# Patient Record
Sex: Male | Born: 1980 | Race: Asian | Hispanic: Yes | Marital: Single | Smoking: Light tobacco smoker
Health system: Southern US, Community
[De-identification: ages and names within clinical notes are randomized; demographics above are authoritative.]

## PROBLEM LIST (undated history)

## (undated) DIAGNOSIS — I10 Essential (primary) hypertension: Secondary | ICD-10-CM

---

## 2017-10-12 ENCOUNTER — Emergency Department (HOSPITAL_COMMUNITY): Payer: Worker's Compensation

## 2017-10-12 ENCOUNTER — Emergency Department (HOSPITAL_COMMUNITY)
Admission: EM | Admit: 2017-10-12 | Discharge: 2017-10-12 | Disposition: A | Discharge status: Other Acute Inpatient Hospital | Payer: Worker's Compensation | Attending: Student | Admitting: Student

## 2017-10-12 ENCOUNTER — Encounter (HOSPITAL_COMMUNITY): Payer: Self-pay | Admitting: Emergency Medicine

## 2017-10-12 DIAGNOSIS — W231XXA Caught, crushed, jammed, or pinched between stationary objects, initial encounter: Secondary | ICD-10-CM | POA: Diagnosis not present

## 2017-10-12 DIAGNOSIS — Z23 Encounter for immunization: Secondary | ICD-10-CM | POA: Insufficient documentation

## 2017-10-12 DIAGNOSIS — Y929 Unspecified place or not applicable: Secondary | ICD-10-CM | POA: Insufficient documentation

## 2017-10-12 DIAGNOSIS — Y990 Civilian activity done for income or pay: Secondary | ICD-10-CM | POA: Insufficient documentation

## 2017-10-12 DIAGNOSIS — S63295A Dislocation of distal interphalangeal joint of left ring finger, initial encounter: Secondary | ICD-10-CM | POA: Diagnosis not present

## 2017-10-12 DIAGNOSIS — S6992XA Unspecified injury of left wrist, hand and finger(s), initial encounter: Secondary | ICD-10-CM | POA: Diagnosis present

## 2017-10-12 DIAGNOSIS — I10 Essential (primary) hypertension: Secondary | ICD-10-CM | POA: Insufficient documentation

## 2017-10-12 DIAGNOSIS — Y939 Activity, unspecified: Secondary | ICD-10-CM | POA: Insufficient documentation

## 2017-10-12 HISTORY — DX: Essential (primary) hypertension: I10

## 2017-10-12 LAB — CBC WITH DIFFERENTIAL/PLATELET
BASOS PCT: 0 %
Basophils Absolute: 0 10*3/uL (ref 0.0–0.1)
Eosinophils Absolute: 0.2 10*3/uL (ref 0.0–0.7)
Eosinophils Relative: 2 %
HEMATOCRIT: 40.9 % (ref 39.0–52.0)
HEMOGLOBIN: 13.7 g/dL (ref 13.0–17.0)
LYMPHS ABS: 2.8 10*3/uL (ref 0.7–4.0)
Lymphocytes Relative: 33 %
MCH: 30.1 pg (ref 26.0–34.0)
MCHC: 33.5 g/dL (ref 30.0–36.0)
MCV: 89.9 fL (ref 78.0–100.0)
MONO ABS: 0.6 10*3/uL (ref 0.1–1.0)
MONOS PCT: 7 %
NEUTROS ABS: 5 10*3/uL (ref 1.7–7.7)
NEUTROS PCT: 58 %
Platelets: 174 10*3/uL (ref 150–400)
RBC: 4.55 MIL/uL (ref 4.22–5.81)
RDW: 12.7 % (ref 11.5–15.5)
WBC: 8.6 10*3/uL (ref 4.0–10.5)

## 2017-10-12 LAB — COMPREHENSIVE METABOLIC PANEL
ALBUMIN: 4 g/dL (ref 3.5–5.0)
ALK PHOS: 165 U/L — AB (ref 38–126)
ALT: 154 U/L — ABNORMAL HIGH (ref 17–63)
ANION GAP: 9 (ref 5–15)
AST: 79 U/L — ABNORMAL HIGH (ref 15–41)
BILIRUBIN TOTAL: 0.7 mg/dL (ref 0.3–1.2)
BUN: 9 mg/dL (ref 6–20)
CALCIUM: 9 mg/dL (ref 8.9–10.3)
CO2: 22 mmol/L (ref 22–32)
Chloride: 108 mmol/L (ref 101–111)
Creatinine, Ser: 0.8 mg/dL (ref 0.61–1.24)
GLUCOSE: 212 mg/dL — AB (ref 65–99)
POTASSIUM: 4.1 mmol/L (ref 3.5–5.1)
Sodium: 139 mmol/L (ref 135–145)
TOTAL PROTEIN: 7.5 g/dL (ref 6.5–8.1)

## 2017-10-12 LAB — TYPE AND SCREEN
ABO/RH(D): O POS
Antibody Screen: NEGATIVE

## 2017-10-12 MED ORDER — HYDROMORPHONE HCL PF 1 MG/ML IJ SOLN
.50 | INTRAMUSCULAR | Status: DC
Start: ? — End: 2017-10-12

## 2017-10-12 MED ORDER — PIPERACILLIN-TAZOBACTAM 3.375 G IVPB 30 MIN
3.3750 g | INTRAVENOUS | Status: DC
  Filled 2017-10-12: qty 50

## 2017-10-12 MED ORDER — MORPHINE SULFATE (PF) 4 MG/ML IV SOLN
4.0000 mg | Freq: Once | INTRAVENOUS | Status: AC
  Administered 2017-10-12: 4 mg via INTRAVENOUS

## 2017-10-12 MED ORDER — ONDANSETRON HCL 4 MG/2ML IJ SOLN
4.0000 mg | Freq: Once | INTRAMUSCULAR | Status: AC
  Administered 2017-10-12: 4 mg via INTRAVENOUS
  Filled 2017-10-12: qty 2

## 2017-10-12 MED ORDER — MORPHINE SULFATE (PF) 4 MG/ML IV SOLN
4.0000 mg | Freq: Once | INTRAVENOUS | Status: AC
  Administered 2017-10-12: 4 mg via INTRAVENOUS
  Filled 2017-10-12: qty 1

## 2017-10-12 MED ORDER — TETANUS-DIPHTH-ACELL PERTUSSIS 5-2.5-18.5 LF-MCG/0.5 IM SUSP
0.5000 mL | Freq: Once | INTRAMUSCULAR | Status: AC
  Administered 2017-10-12: 0.5 mL via INTRAMUSCULAR
  Filled 2017-10-12: qty 0.5

## 2017-10-12 MED ORDER — SODIUM CHLORIDE 0.9 % IV BOLUS (SEPSIS)
1000.0000 mL | Freq: Once | INTRAVENOUS | Status: AC
  Administered 2017-10-12: 1000 mL via INTRAVENOUS

## 2017-10-12 MED ORDER — MORPHINE SULFATE (PF) 4 MG/ML IV SOLN
INTRAVENOUS | Status: AC
  Administered 2017-10-12: 4 mg via INTRAVENOUS
  Filled 2017-10-12: qty 1

## 2017-10-12 MED ORDER — CEFAZOLIN SODIUM-DEXTROSE 2-4 GM/100ML-% IV SOLN
2.0000 g | Freq: Once | INTRAVENOUS | Status: AC
  Administered 2017-10-12: 2 g via INTRAVENOUS
  Filled 2017-10-12: qty 100

## 2017-10-12 MED ORDER — PIPERACILLIN-TAZOBACTAM 3.375 G IVPB: 3.3750 g | Freq: Three times a day (TID) | INTRAVENOUS | Status: DC

## 2017-10-12 MED ORDER — LACTATED RINGERS IV SOLN
INTRAVENOUS | Status: DC
Start: 2017-10-12 — End: 2017-10-12

## 2017-10-12 NOTE — ED Triage Notes (Signed)
Pt with L hand injury, degloving, pt with decreased sensation to L hand. Pt states it was a crushing injury.

## 2017-10-12 NOTE — ED Notes (Addendum)
Called radiology to have CD made to be transported to another  facility

## 2017-10-12 NOTE — ED Provider Notes (Signed)
[facility]-EMERGENCY DEPT Provider Note   CSN: [id number] Arrival date & time: 10/12/17  1444     History   Chief Complaint Chief Complaint  Patient presents with  . Hand Injury    HPI Scott Lloyd is a 37 y.o. male.  HPI 37 year old Hispanic male past medical history significant for hypertension presents to the emergency department today with complaints of hand injury.  Patient states that he was working with an Environmental education officerindustrial roller and got his hand caught between the 2 rollers and pulled his hand back.  Patient reports decreased sensation to his left pinky finger.  Patient reports significant pain.  Unsure last tetanus shot.  Reports paresthesias and weakness to the left pinky finger.  He is not take anything for the pain prior to arrival.  Range of motion and palpation makes the pain worse. Past Medical History:  Diagnosis Date  . Hypertension     There are no active problems to display for this patient.   History reviewed. No pertinent surgical history.     Home Medications    Prior to Admission medications   Not on File    Family History History reviewed. No pertinent family history.  Social History Social History   Tobacco Use  . Smoking status: Light Tobacco Smoker  . Smokeless tobacco: Former Engineer, waterUser  Substance Use Topics  . Alcohol use: Yes  . Drug use: No     Allergies   Patient has no known allergies.   Review of Systems Review of Systems  Constitutional: Negative for chills and fever.  Musculoskeletal: Positive for myalgias.  Skin: Positive for wound.  Neurological: Positive for weakness and numbness.     Physical Exam Updated Vital Signs BP (!) 147/105   Pulse 93   Temp 98.2 F (36.8 C)   Resp 20   SpO2 99%   Physical Exam  Constitutional: He appears well-developed and well-nourished. No distress.  HENT:  Head: Normocephalic and atraumatic.  Eyes: Conjunctivae are normal. Pupils are equal, round, and reactive  to light. Right eye exhibits no discharge. Left eye exhibits no discharge. No scleral icterus.  Neck: Normal range of motion.  Cardiovascular: Normal rate.  Pulmonary/Chest: Effort normal. No respiratory distress.  Abdominal: Soft.  Musculoskeletal: Normal range of motion.  Patient has partial degloving of the dorsum of the left hand.  Tendon and artery exposure.  No signs of arterial bleed.  Patient has full flexion extension of the first 4 fingers of the left hand at the [facility].  Patient has decreased sensation of the left pinky finger.  Patient has brisk cap refill of the first 4 fingers but does not have her brisk refill in the left pinky finger.  Significant amount of debris noted.    Radial pulses are 2+ bilaterally.  Sensation is intact in all phalanges except for the left pinky finger.  Significant pain noted.  Patient with full range of motion of the left wrist, left elbow, left shoulder without pain.  Neurological: He is alert.  Skin: Skin is warm and dry. Capillary refill takes less than 2 seconds. No pallor.  Psychiatric: His behavior is normal. Judgment and thought content normal.  Nursing note and vitals reviewed.              ED Treatments / Results  Labs (all labs ordered are listed, but only abnormal results are displayed) Labs Reviewed  COMPREHENSIVE METABOLIC PANEL - Abnormal; Notable for the following components:  Result Value   Glucose, Bld 212 (*)    AST 79 (*)    ALT 154 (*)    Alkaline Phosphatase 165 (*)    All other components within normal limits  CBC WITH DIFFERENTIAL/PLATELET  TYPE AND SCREEN    EKG  EKG Interpretation None       Radiology Dg Forearm Left  Result Date: 10/12/2017 CLINICAL DATA:  Crush injury EXAM: LEFT FOREARM - 2 VIEW COMPARISON:  No comparison studies available. FINDINGS: No evidence for an acute fracture in the radius or ulna. No worrisome lytic or sclerotic osseous abnormality. IMPRESSION:  Negative. Electronically Signed   By: Kennith Center M.D.   On: 10/12/2017 16:03   Dg Wrist Complete Left  Result Date: 10/12/2017 CLINICAL DATA:  Crush injury today with fifth finger pain and medial hand pain. EXAM: LEFT WRIST - COMPLETE 3+ VIEW COMPARISON:  None. FINDINGS: There is no evidence of fracture or dislocation. There is no evidence of arthropathy or other focal bone abnormality. Soft tissues are unremarkable. IMPRESSION: Negative. Electronically Signed   By: Elberta Fortis M.D.   On: 10/12/2017 15:49   Dg Hand Complete Left  Result Date: 10/12/2017 CLINICAL DATA:  Crush injury of the left hand. EXAM: LEFT HAND - COMPLETE 3+ VIEW COMPARISON:  None. FINDINGS: Dorsal dislocation of the fifth DIP joint. No other fracture or dislocation. Severe soft tissue swelling and laceration along the ulnar half of the left hand. IMPRESSION: Dorsal dislocation of the fifth DIP joint. No other fracture or dislocation. Electronically Signed   By: Elige Ko   On: 10/12/2017 15:48    Procedures .Critical Care Performed by: Rise Mu, PA-C Authorized by: Rise Mu, PA-C   Critical care provider statement:    Critical care time (minutes):  55   Critical care was necessary to treat or prevent imminent or life-threatening deterioration of the following conditions:  Trauma (hand trauma that required immediate transfer to outside facility for surgical intervention given neurovascular compromise)   Critical care was time spent personally by me on the following activities:  Development of treatment plan with patient or surrogate, discussions with consultants, discussions with primary provider, evaluation of patient's response to treatment, ordering and performing treatments and interventions, ordering and review of laboratory studies, ordering and review of radiographic studies, pulse oximetry, re-evaluation of patient's condition and review of old charts   (including critical care  time)  Medications Ordered in ED Medications  ondansetron (ZOFRAN) injection 4 mg (4 mg Intravenous Given 10/12/17 1510)  morphine 4 MG/ML injection 4 mg (4 mg Intravenous Given 10/12/17 1510)  Tdap (BOOSTRIX) injection 0.5 mL (0.5 mLs Intramuscular Given 10/12/17 1524)  sodium chloride 0.9 % bolus 1,000 mL (0 mLs Intravenous Stopped 10/12/17 1605)  ceFAZolin (ANCEF) IVPB 2g/100 mL premix (2 g Intravenous New Bag/Given 10/12/17 1601)  morphine 4 MG/ML injection 4 mg (4 mg Intravenous Given 10/12/17 1601)     Initial Impression / Assessment and Plan / ED Course  I have reviewed the triage vital signs and the nursing notes.  Pertinent labs & imaging results that were available during my care of the patient were reviewed by me and considered in my medical decision making (see chart for details).     Patient presents to the ED for evaluation of degloving injury to the left hand.  Radial pulses are 2+ bilaterally.  Brisk cap refill in all phalanges the left hand except for the left fifth finger.  Appears to be blood flow  compromise.  Decreased sensation at the left pinky finger.  Patient's tetanus shot updated in the ED.  Given 2 g of Ancef for contaminated wound.  X-rays obtained shows dislocation of the DIP of the fifth phalanx.  I spoke with Dr. Orlan Leavens with hand surgery who recommends patient be transferred to [facility] as this may require reconstructive surgery.  I spoke with Dr. Charm Barges with Ortho hand trauma at [facility].  She has accepted patient in transfer for ER to ER.  She will see patient in the ER.  Patient remains hemodynamically stable this time.  Was given fluid bolus in the ED.  No significant signs of blood loss.  He is not tachycardic or hypotensive.  Again no signs of arterial bleeding.  Patient was also examined by my attending who is agreed with the above plan.  Patient given pain medicine in the ED.  [facility] here for transfer at this time.  Patient was reassessed and remains  hemodynamically stable in no acute distress.  Final Clinical Impressions(s) / ED Diagnoses   Final diagnoses:  Injury of left hand, initial encounter    ED Discharge Orders    None       Wallace Keller 10/12/17 1652    Rise Mu, PA-C 10/12/17 2219

## 2017-10-12 NOTE — ED Notes (Signed)
Patient brought in via private vehicle from work site with complaints of lacerations to left hand. Degloving to left hand noted in triage with uncontrolled bleeding. EDMD called to bedside. VSS. PIV placed. Dressing placed to site. EDPA completed neurovascular assessment of patient left hand. 3+ Radial and ulnar pulse noted by EDPA.

## 2017-10-12 NOTE — ED Notes (Signed)
[facility]. Report has been called by this Clinical research associatewriter to [place], Air cabin crewN ED Charge.

## 2017-10-12 NOTE — ED Provider Notes (Deleted)
              Rise MuLeaphart, Jwan Hornbaker T, PA-C 10/12/17 1503

## 2017-10-12 NOTE — ED Provider Notes (Signed)
Medical screening examination/treatment/procedure(s) were conducted as a shared visit with non-physician practitioner(s) and myself.  I personally evaluated the patient during the encounter. Briefly, the patient is a 37 y.o. male who sustained a crush injury to the left hand.  Patient left small finger is neurovascularly compromised.  Images were uploaded.  Case is discussed with our on-call hand surgeon upon review of images felt that the patient required higher level of care [facility].  [facility]..    EKG Interpretation None           Zohar Laing, Amadeo GarnetPedro Eduardo, MD 10/12/17 1954

## 2017-10-13 LAB — ABO/RH: ABO/RH(D): O POS

## 2017-10-14 MED FILL — Fentanyl Citrate Preservative Free (PF) Inj 100 MCG/2ML: INTRAMUSCULAR | Qty: 4 | Status: AC

## 2018-11-01 IMAGING — CR DG WRIST COMPLETE 3+V*L*
4 series · 4 of 4 positions shown · non-contrast
Comparison: None.

CLINICAL DATA: Crush injury today with fifth finger pain and medial
hand pain.

EXAM:
LEFT WRIST - COMPLETE 3+ VIEW

[x wrist pa left]
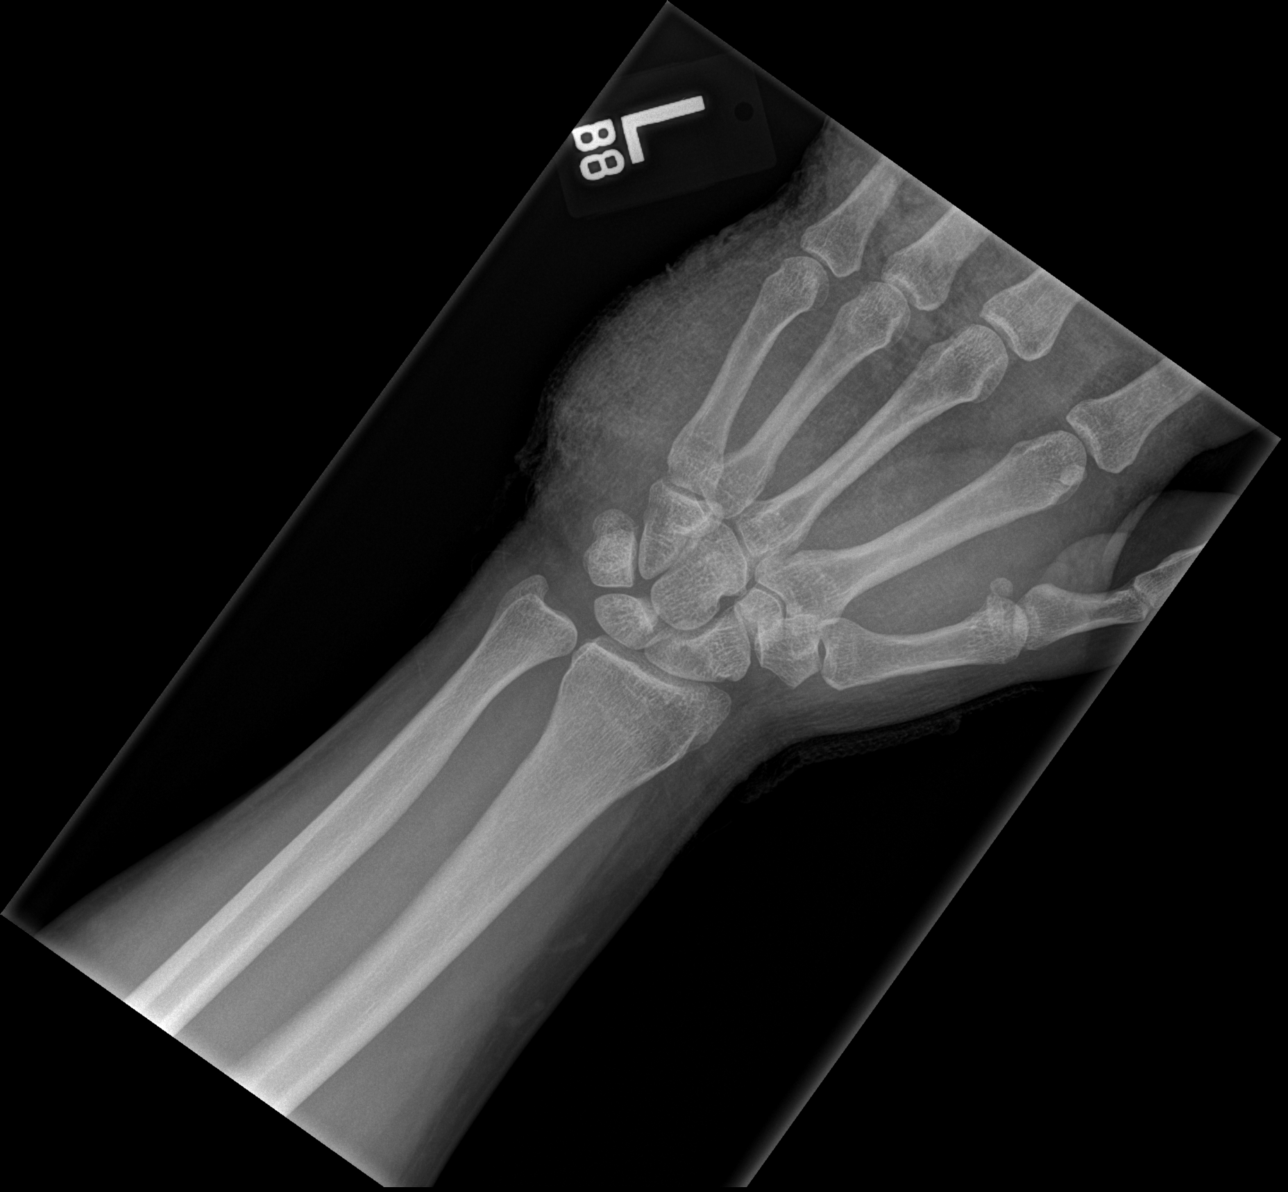

[x wrist obl left]
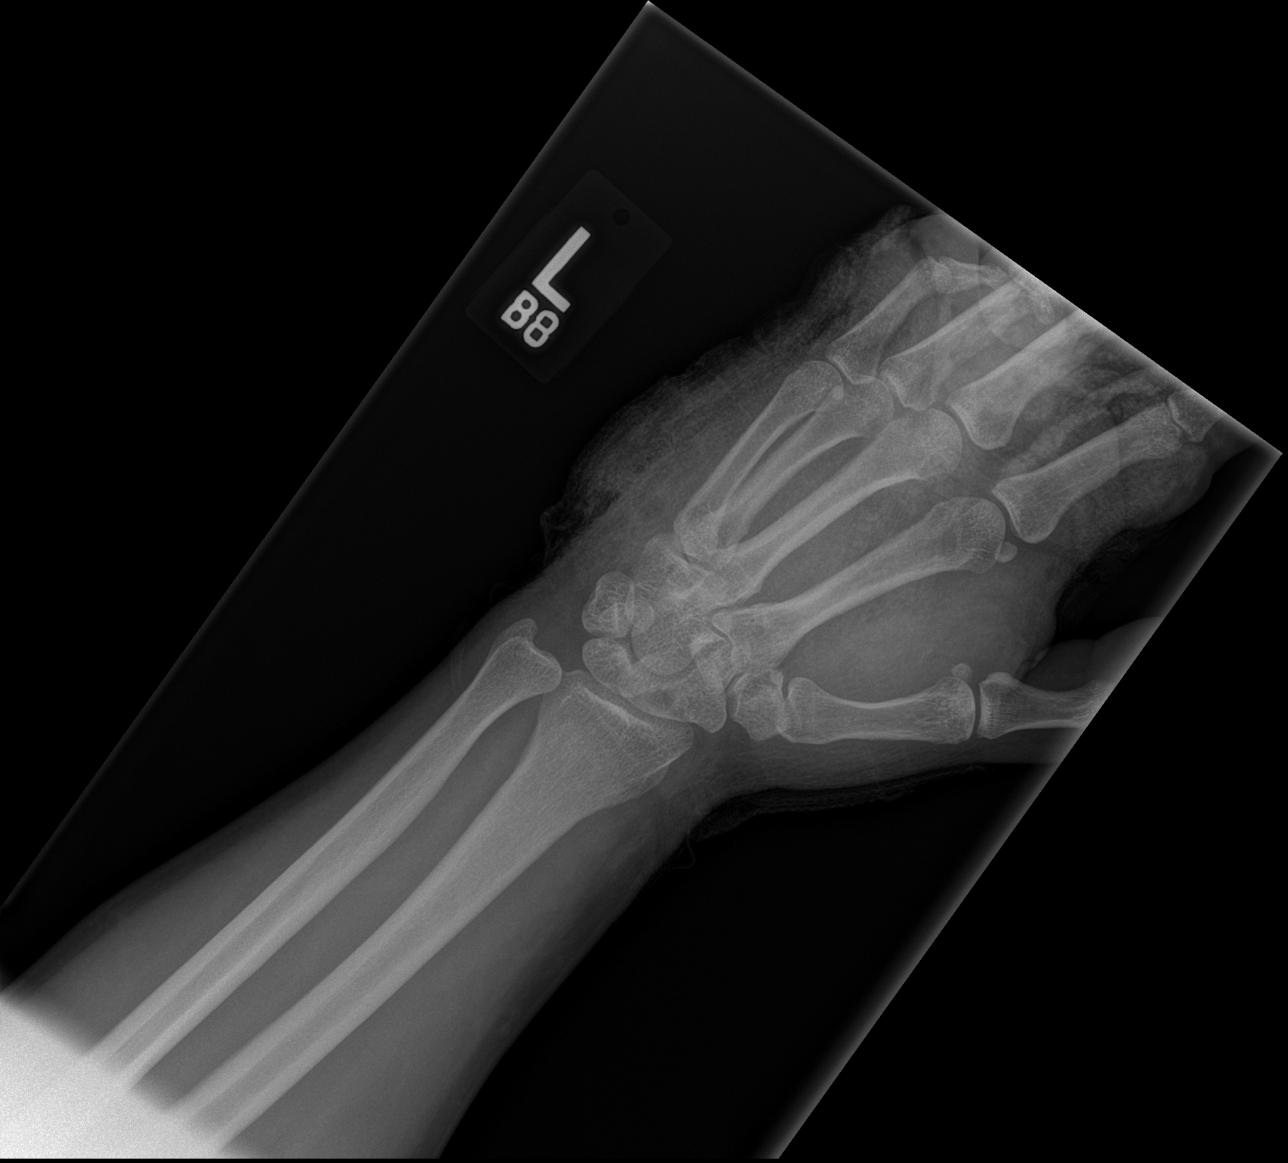

[x wrist lat left]
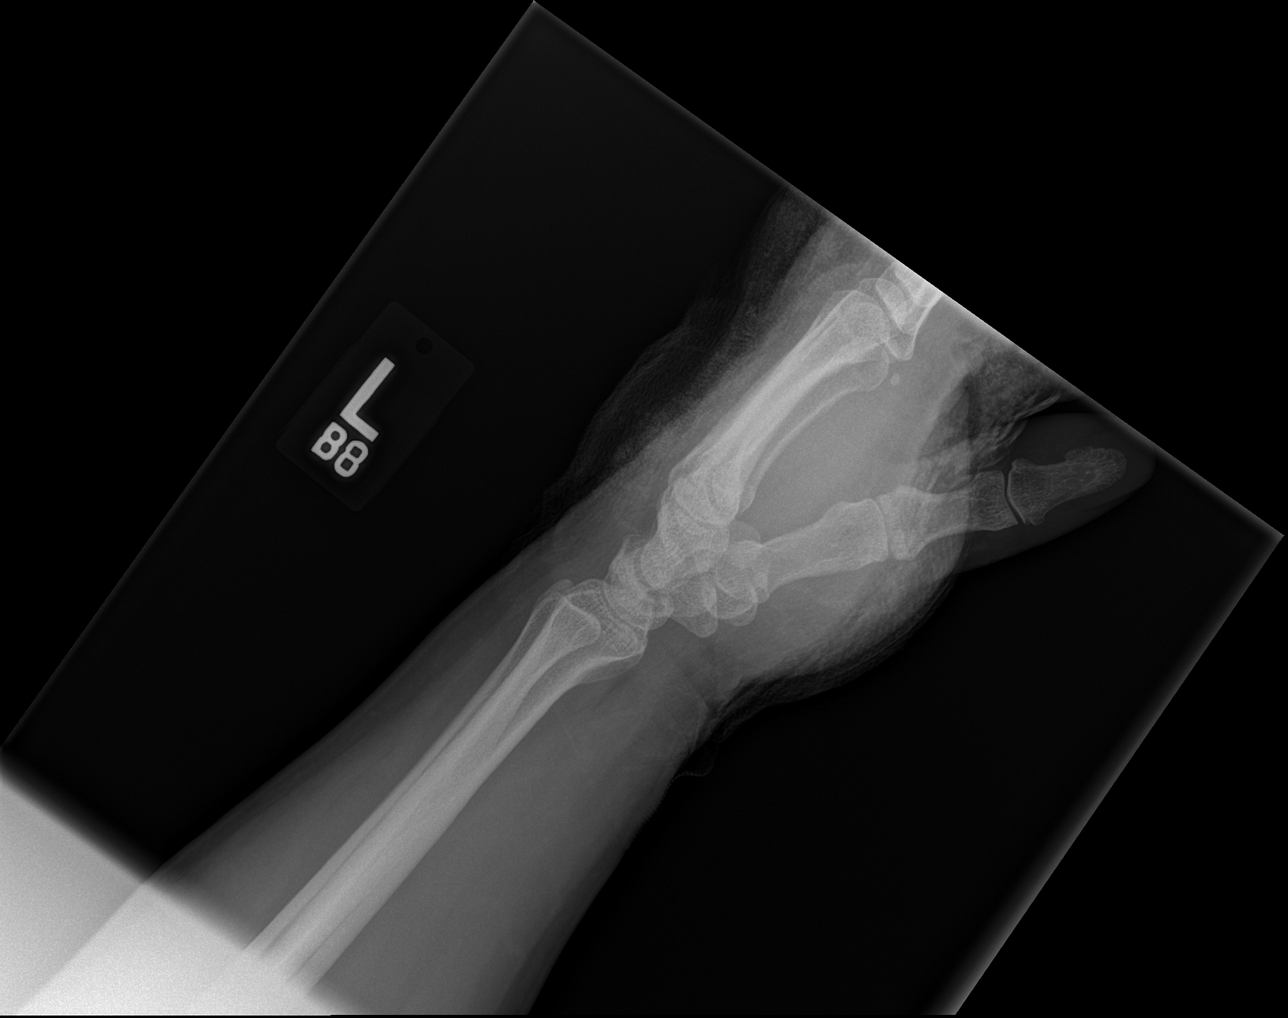

[x wrist navicular view left]
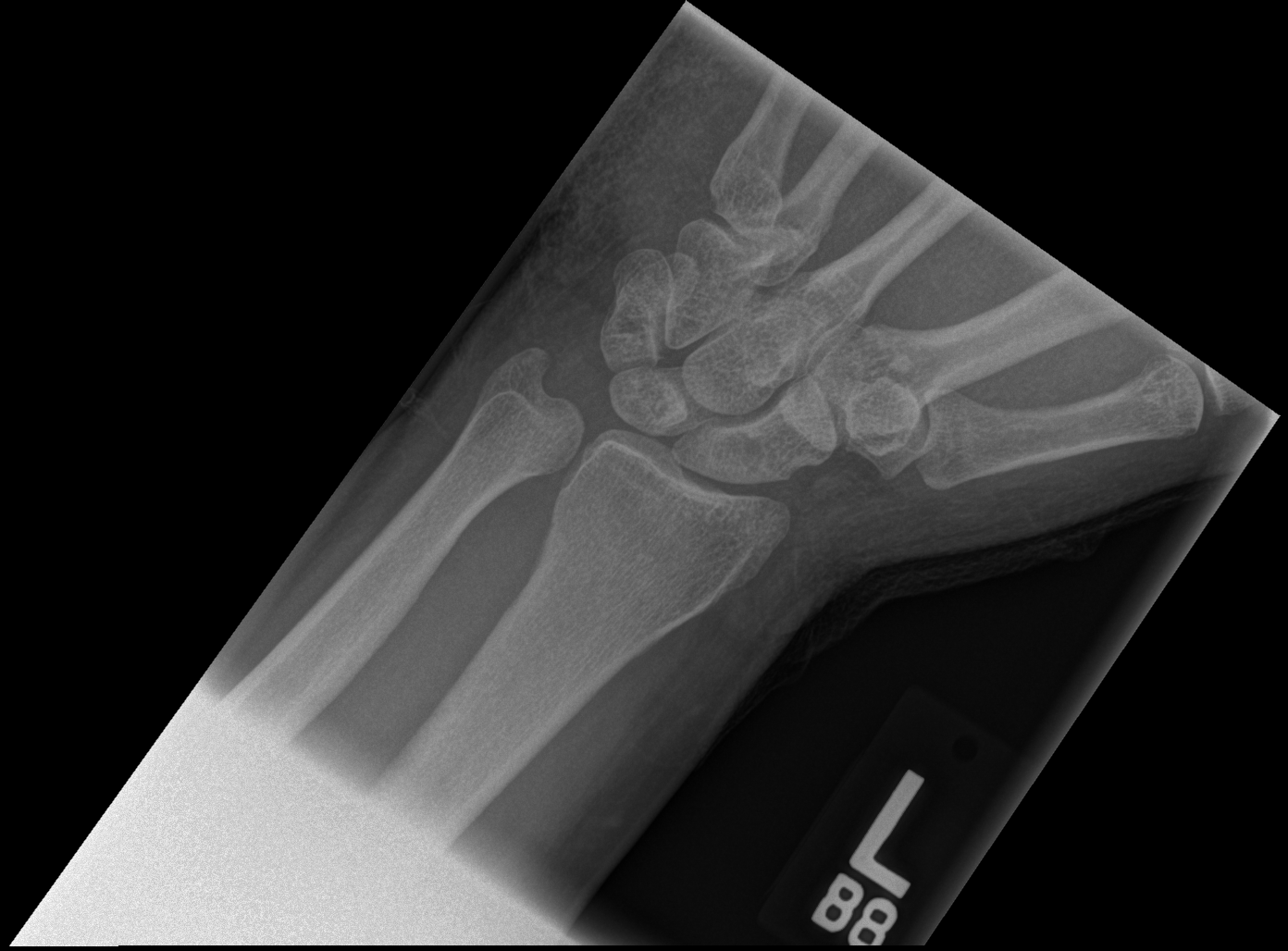

[4 of 4 positions shown; findings below may reference images not displayed]

FINDINGS: There is no evidence of fracture or dislocation. There is no
evidence of arthropathy or other focal bone abnormality. Soft
tissues are unremarkable.
IMPRESSION: Negative.
